# Patient Record
Sex: Female | Born: 2000 | Race: White | Hispanic: No | Marital: Single | Smoking: Never smoker
Health system: Southern US, Community
[De-identification: ages and names within clinical notes are randomized; demographics above are authoritative.]

## PROBLEM LIST (undated history)

## (undated) HISTORY — PX: NO PAST SURGERIES: SHX2092

---
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
## 2021-04-19 ENCOUNTER — Other Ambulatory Visit: Payer: Self-pay | Admitting: Obstetrics & Gynecology

## 2021-04-19 DIAGNOSIS — Z30011 Encounter for initial prescription of contraceptive pills: Secondary | ICD-10-CM

## 2021-07-18 ENCOUNTER — Other Ambulatory Visit: Payer: Self-pay | Admitting: Obstetrics & Gynecology

## 2021-07-18 DIAGNOSIS — Z30011 Encounter for initial prescription of contraceptive pills: Secondary | ICD-10-CM

## 2022-01-05 ENCOUNTER — Ambulatory Visit: Payer: Medicaid Other | Admitting: Obstetrics and Gynecology

## 2022-09-19 ENCOUNTER — Other Ambulatory Visit: Payer: Self-pay | Admitting: Obstetrics & Gynecology

## 2022-09-19 DIAGNOSIS — Z30011 Encounter for initial prescription of contraceptive pills: Secondary | ICD-10-CM

## 2023-10-23 DIAGNOSIS — N62 Hypertrophy of breast: Secondary | ICD-10-CM | POA: Insufficient documentation

## 2024-02-11 ENCOUNTER — Ambulatory Visit: Payer: Self-pay

## 2024-02-11 NOTE — Telephone Encounter (Signed)
 FYI Only or Action Required?: FYI only for provider.  Patient was last seen in primary care on N/A.  Called Nurse Triage reporting Sleeping Problem.  Symptoms began several weeks ago.  Interventions attempted: Other: normally Concerta, but pt out .  Symptoms are: gradually worsening.  Triage Disposition: Go to ED Now (or PCP Triage)  Patient/caregiver understands and will follow disposition?: Yes  Copied from CRM [id number]. Topic: Clinical - Red Word Triage >> Feb 11, 2024 10:05 AM Donna BRAVO wrote: Red Word that prompted transfer to Nurse Triage: Patient is establishing care Primary Care at [facility] with Dr Raguel Blush MD  Patient has AHD takes Concerta  26mg  and Bupropion 350mg  Patient has not taken Concerta for a 1 1/2 weeks has rationed and takes only when working.  Symptoms: Excessively tired and is getting worse Sleep most of the day Sleeping 12 hours at a time and 6 hour naps Having a hard time staying awake, 30 to 90 minutes, starts to get really sleepy, unable to stay awake.    ----------------------------------------------------------------------- From previous Reason for Contact - Scheduling: Patient/patient representative is calling to schedule an appointment. Refer to attachments for appointment information.  Patient calling to establish care with new provider new patient appt Reason for Disposition  Nursing judgment or information in reference  Answer Assessment - Initial Assessment Questions Pt calls in and states she takes Concerta but has been out for about 1.5 weeks. Was rationing and trying to only take it for work. But lately pt states She has severe difficulty staying awake for more than 90 minutes at a time. She is excessively tired, sleeps 12 hours at night and has 6 hour naps during the day. When she is awake, she feels a constant pull and heaviness in her eyes as well as severe fatigue. New Pt OV scheduled for November, but pt referred to UC/ED by  Triage RN d/t safety concern w/o medication/evaluation.   1. REASON FOR CALL: What is your main concern right now?     Excessive sleeping; out of concerta 2. ONSET: When did the excessive sleeping start?     2.5 weeks ago 3. SEVERITY: How bad is the excessive sleeping?     severe 4. FUNCTIONAL IMPAIRMENT: How have things been going for you overall? Have you had more difficulty than usual doing your normal daily activities? (e.g., self-care, school, work, interactions)     Yes; doesn't have time for anything d/t the sleepiness 5. RELIEVING AND AGGRAVATING FACTORS: What makes it better or worse? (e.g., certain activities, rest)     unsure 6. FEVER: Do you have a fever?     denies 7. OTHER SYMPTOMS: Do you have any other new symptoms?     denies 8. TREATMENTS AND RESPONSE: What have you done so far to try to make this better? What medicines have you used?     Concerta, but currently out of rx 9. PREGNANCY: Is there any chance you are pregnant? When was your last menstrual period?     LMP 10/7  Protocols used: No Guideline Available-A-AH

## 2024-03-25 ENCOUNTER — Ambulatory Visit

## 2024-03-25 VITALS — BP 112/82 | HR 84 | Temp 99.1°F | Resp 16 | Ht 63.0 in | Wt 155.1 lb

## 2024-03-25 DIAGNOSIS — F909 Attention-deficit hyperactivity disorder, unspecified type: Secondary | ICD-10-CM | POA: Diagnosis not present

## 2024-03-25 DIAGNOSIS — F32A Depression, unspecified: Secondary | ICD-10-CM | POA: Diagnosis not present

## 2024-03-25 MED ORDER — METHYLPHENIDATE HCL ER (OSM) 27 MG PO TBCR: 27.0000 mg | EXTENDED_RELEASE_TABLET | ORAL | 0 refills | Status: DC

## 2024-03-25 MED ORDER — BUPROPION HCL ER (XL) 300 MG PO TB24: 300.0000 mg | ORAL_TABLET | Freq: Every day | ORAL | 2 refills | Status: AC

## 2024-03-25 NOTE — Progress Notes (Signed)
 Patient ID: Christina Bauer, female    DOB: 01/11/2001  MRN: [id number]  CC: Establish Care (Patient is here to established care with provider./~health hx address/~care gaps address/ )   Subjective: Christina Bauer is a 23 y.o. female with past medical history of ADHD and depression who presents to clinic to establish care.  Patient reports that she has been out of medication and is starting to experience difficulty concentrating on tasks.    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   No current facility-administered medications on file prior to visit.    No Known Allergies  Social History   Socioeconomic History   Marital status: Single    Spouse name: Not on file   Number of children: Not on file   Years of education: Not on file   Highest education level: Not on file  Occupational History   Not on file  Tobacco Use   Smoking status: Never   Smokeless tobacco: Never  Vaping Use   Vaping status: Never Used  Substance and Sexual Activity   Alcohol use: Never   Drug use: Never   Sexual activity: Yes    Birth control/protection: None  Other Topics Concern   Not on file  Social History Narrative   Not on file   Social Drivers of Health   Financial Resource Strain: Low Risk  (03/25/2024)   Overall Financial Resource Strain (CARDIA)    Difficulty of Paying Living Expenses: Not very hard  Food Insecurity: No Food Insecurity (03/25/2024)   Hunger Vital Sign    Worried About Running Out of Food in the Last Year: Never true    Ran Out of Food in the Last Year: Never true  Transportation Needs: No  Transportation Needs (03/25/2024)   PRAPARE - Administrator, Civil Service (Medical): No    Lack of Transportation (Non-Medical): No  Physical Activity: Sufficiently Active (03/25/2024)   Exercise Vital Sign    Days of Exercise per Week: 5 days    Minutes of Exercise per Session: 60 min  Stress: No Stress Concern Present (03/25/2024)   [employer] of Occupational Health - Occupational Stress Questionnaire    Feeling of Stress: Not at all  Social Connections: Moderately Isolated (03/25/2024)   Social Connection and Isolation Panel    Frequency of Communication with Friends and Family: Twice a week    Frequency of Social Gatherings with Friends and Family: Twice a week    Attends Religious Services: 1 to 4 times per year    Active Member of [employer] or Organizations: Patient unable to answer    Attends Banker Meetings: Never    Marital Status: Never married  Intimate Partner Violence: Not At Risk (03/25/2024)   Humiliation, Afraid, Rape, and Kick questionnaire    Fear of Current or Ex-Partner: No    Emotionally Abused: No    Physically Abused: No    Sexually Abused: No    Family History  Problem Relation Age of Onset   Seizures Father     Past Surgical History:  Procedure Laterality Date  NO PAST SURGERIES      ROS: Review of Systems Negative except as stated above  PHYSICAL EXAM: BP 112/82   Pulse 84   Temp 99.1 F (37.3 C) (Oral)   Resp 16   Ht 5' 3 (1.6 m)   Wt 155 lb 1.6 oz (70.4 kg)   SpO2 98%   BMI 27.47 kg/m   Physical Exam  General: well-appearing, no acute distress Skin: no jaundice, rashes, or lesions Cardiovascular: regular heart rate and rhythm, normal S1/S2, no murmurs, gallops, or rubs, peripheral pulses 2+ bilaterally Chest: no skeletal deformity, lungs clear to auscultation bilaterally, equal breath sounds bilaterally Musculoskeletal: strength of upper and lower extremities equal bilaterally, normal  gait Extremities: no peripheral edema   ASSESSMENT AND PLAN:  1. Attention deficit hyperactivity disorder (ADHD), unspecified ADHD type (Primary) - Per chart review, pt has been previously diagnosed with ADHD and taking current medication regimen.  - Continue methylphenidate  27 MG PO CR tablet; Take 1 tablet (27 mg total) by mouth every morning.  Dispense: 30 tablet; Refill: 0  2. Depression, unspecified depression type - Continue buPROPion  (WELLBUTRIN  XL) 300 MG 24 hr tablet; Take 1 tablet (300 mg total) by mouth daily.  Dispense: 30 tablet; Refill: 2    Patient was given the opportunity to ask questions.  Patient verbalized understanding of the plan and was able to repeat key elements of the plan.    No orders of the defined types were placed in this encounter.    Requested Prescriptions   Signed Prescriptions Disp Refills   buPROPion  (WELLBUTRIN  XL) 300 MG 24 hr tablet 30 tablet 2    Sig: Take 1 tablet (300 mg total) by mouth daily.   methylphenidate  27 MG PO CR tablet 30 tablet 0    Sig: Take 1 tablet (27 mg total) by mouth every morning.    Return in about 1 month (around 04/24/2024) for follow-up, med refill.  Sula Leavy Rode, PA-C

## 2024-05-07 ENCOUNTER — Ambulatory Visit

## 2024-05-07 VITALS — BP 126/86 | HR 75 | Temp 98.2°F | Resp 16 | Wt 153.0 lb

## 2024-05-07 DIAGNOSIS — F32A Depression, unspecified: Secondary | ICD-10-CM | POA: Diagnosis not present

## 2024-05-07 DIAGNOSIS — F909 Attention-deficit hyperactivity disorder, unspecified type: Secondary | ICD-10-CM | POA: Diagnosis not present

## 2024-05-07 MED ORDER — METHYLPHENIDATE HCL ER (OSM) 36 MG PO TBCR: 36.0000 mg | EXTENDED_RELEASE_TABLET | Freq: Every day | ORAL | 0 refills | Status: AC

## 2024-05-07 NOTE — Progress Notes (Unsigned)
" ° ° ° °  Patient ID: Christina Bauer, female    DOB: 23-Jan-2001  MRN: [id number]  CC: Medical Management of Chronic Issues   Subjective: Christina Bauer is a 24 y.o. female with past medical history of ADHD and depression who presents to clinic for Follow up. Pt restar prescription medications last visit for ADHD and depression. Pt reports she now feels better organized and more focused. Not experiencing any side effects.   Allergies[1]  ROS: Review of Systems Negative except as stated above  PHYSICAL EXAM: BP 126/86   Pulse 75   Temp 98.2 F (36.8 C) (Oral)   Resp 16   Wt 153 lb (69.4 kg)   SpO2 98%   BMI 27.10 kg/m   Physical Exam  General: well-appearing, no acute distress Skin: no jaundice, rashes, or lesions Cardiovascular: regular heart rate and rhythm, normal S1/S2, no murmurs, gallops, or rubs, peripheral pulses 2+ bilaterally Chest: no skeletal deformity, lungs clear to auscultation bilaterally, equal breath sounds bilaterally Musculoskeletal: normal gait Extremities: no peripheral edema  ASSESSMENT AND PLAN:  1. Attention deficit hyperactivity disorder (ADHD), unspecified ADHD type (Primary) - Symptoms of ADHD improving since restarting medication - ToxASSURE Select 13 (MW), Urine - Continue methylphenidate  36 MG PO CR tablet; Take 1 tablet (36 mg total) by mouth daily.  Dispense: 90 tablet; Refill: 0  2. Depression, unspecified depression type - Stable - Continue bupropion  300mg  daily    Patient was given the opportunity to ask questions.  Patient verbalized understanding of the plan and was able to repeat key elements of the plan.    Orders Placed This Encounter  Procedures   ToxASSURE Select 13 (MW), Urine     Requested Prescriptions   Signed Prescriptions Disp Refills   methylphenidate  36 MG PO CR tablet 90 tablet 0    Sig: Take 1 tablet (36 mg total) by mouth daily.    Return in about 6 months (around 11/04/2024) for follow-up.  Sula Cower  Genieve Ramaswamy, PA-C      [1] No Known Allergies  "

## 2024-05-11 LAB — TOXASSURE SELECT 13 (MW), URINE

## 2024-06-08 ENCOUNTER — Other Ambulatory Visit: Payer: Self-pay

## 2024-08-05 ENCOUNTER — Other Ambulatory Visit

## 2024-08-05 ENCOUNTER — Ambulatory Visit: Payer: Self-pay

## 2024-11-04 ENCOUNTER — Ambulatory Visit
